# Patient Record
Sex: Male | Born: 1975 | Race: White | Hispanic: No | Marital: Single | Smoking: Never smoker
Health system: Southern US, Community
[De-identification: ages and names within clinical notes are randomized; demographics above are authoritative.]

## PROBLEM LIST (undated history)

## (undated) DIAGNOSIS — J302 Other seasonal allergic rhinitis: Secondary | ICD-10-CM

---

## 2014-05-14 ENCOUNTER — Encounter: Payer: Self-pay | Admitting: Emergency Medicine

## 2014-05-14 ENCOUNTER — Emergency Department (INDEPENDENT_AMBULATORY_CARE_PROVIDER_SITE_OTHER)
Admission: EM | Admit: 2014-05-14 | Discharge: 2014-05-14 | Disposition: A | Discharge status: Home / Self Care | Payer: BC Managed Care – PPO | Source: Home / Self Care | Attending: Emergency Medicine | Admitting: Emergency Medicine

## 2014-05-14 DIAGNOSIS — J039 Acute tonsillitis, unspecified: Secondary | ICD-10-CM

## 2014-05-14 MED ORDER — PENICILLIN V POTASSIUM 500 MG PO TABS: 500.0000 mg | ORAL_TABLET | Freq: Four times a day (QID) | ORAL | Status: DC

## 2014-05-14 NOTE — Discharge Instructions (Signed)

## 2014-05-14 NOTE — ED Provider Notes (Signed)
CSN: [id number]     Arrival date & time 05/14/14  1221 History   First MD Initiated Contact with Patient 05/14/14 1240     Chief Complaint  Patient presents with  . Sore Throat   (Consider location/radiation/quality/duration/timing/severity/associated sxs/prior Treatment) Patient is a 38 y.o. male presenting with pharyngitis. The history is provided by the patient.  Sore Throat This is a new problem. The current episode started 2 days ago. The problem occurs constantly. The problem has been gradually worsening. Pertinent negatives include no headaches and no shortness of breath. Nothing aggravates the symptoms. Nothing relieves the symptoms. He has tried nothing for the symptoms.  Pt reports he feels like he has strep  History reviewed. No pertinent past medical history. History reviewed. No pertinent past surgical history. No family history on file. History  Substance Use Topics  . Smoking status: Never Smoker   . Smokeless tobacco: Not on file  . Alcohol Use: Yes    Review of Systems  Respiratory: Negative for shortness of breath.   Neurological: Negative for headaches.  All other systems reviewed and are negative.   Allergies  Review of patient's allergies indicates no known allergies.  Home Medications   Prior to Admission medications   Medication Sig Start Date End Date Taking? Authorizing Provider  phenylephrine (SUDAFED PE) 10 MG TABS tablet Take 10 mg by mouth every 4 (four) hours as needed.   Yes Historical Provider, MD  sodium chloride (OCEAN) 0.65 % SOLN nasal spray Place 1 spray into both nostrils as needed for congestion.   Yes Historical Provider, MD  penicillin v potassium (VEETID) 500 MG tablet Take 1 tablet (500 mg total) by mouth 4 (four) times daily. 05/14/14 05/21/14  Elson AreasLeslie K Sofia, PA-C   BP 122/81 mmHg  Pulse 85  Temp(Src) 98.5 F (36.9 C) (Oral)  Ht 6\' 2"  (1.88 m)  Wt 233 lb (105.688 kg)  BMI 29.90 kg/m2  SpO2 97% Physical Exam  Constitutional:  He is oriented to person, place, and time. He appears well-developed and well-nourished.  HENT:  Head: Normocephalic and atraumatic.  Eyes: EOM are normal. Pupils are equal, round, and reactive to light.  Neck: Normal range of motion.  Cardiovascular: Normal rate and normal heart sounds.   Pulmonary/Chest: Effort normal.  Abdominal: Soft. He exhibits no distension.  Musculoskeletal: Normal range of motion.  Neurological: He is alert and oriented to person, place, and time.  Skin: Skin is warm.  Psychiatric: He has a normal mood and affect.  Nursing note and vitals reviewed.   ED Course  Procedures (including critical care time) Labs Review Labs Reviewed - No data to display  Imaging Review No results found.   MDM   1. Acute tonsillitis    PCnvk Warm salt water gargles Ibuprofen AVS    Elson AreasLeslie K Sofia, PA-C 05/14/14 1254

## 2014-05-14 NOTE — ED Notes (Signed)
Sore throat x 2 days

## 2014-05-18 ENCOUNTER — Encounter: Payer: Self-pay | Admitting: *Deleted

## 2014-05-18 ENCOUNTER — Emergency Department (INDEPENDENT_AMBULATORY_CARE_PROVIDER_SITE_OTHER)
Admission: EM | Admit: 2014-05-18 | Discharge: 2014-05-18 | Disposition: A | Discharge status: Home / Self Care | Payer: BC Managed Care – PPO | Source: Home / Self Care | Attending: Emergency Medicine | Admitting: Emergency Medicine

## 2014-05-18 DIAGNOSIS — R0981 Nasal congestion: Secondary | ICD-10-CM

## 2014-05-18 DIAGNOSIS — R05 Cough: Secondary | ICD-10-CM

## 2014-05-18 DIAGNOSIS — R059 Cough, unspecified: Secondary | ICD-10-CM

## 2014-05-18 HISTORY — DX: Other seasonal allergic rhinitis: J30.2

## 2014-05-18 MED ORDER — METHYLPREDNISOLONE SODIUM SUCC 40 MG IJ SOLR
125.0000 mg | Freq: Once | INTRAMUSCULAR | Status: AC
  Administered 2014-05-18: 125 mg via INTRAMUSCULAR

## 2014-05-18 NOTE — ED Notes (Signed)
Molli HazardMatthew reports that his tonsillitis is better from his visit on 05/14/14, he now c/o dry cough and nasal congestion.

## 2014-05-18 NOTE — Discharge Instructions (Signed)
Cough, Adult  A cough is a reflex that helps clear your throat and airways. It can help heal the body or may be a reaction to an irritated airway. A cough may only last 2 or 3 weeks (acute) or may last more than 8 weeks (chronic).  CAUSES Acute cough:  Viral or bacterial infections. Chronic cough:  Infections.  Allergies.  Asthma.  Post-nasal drip.  Smoking.  Heartburn or acid reflux.  Some medicines.  Chronic lung problems (COPD).  Cancer. SYMPTOMS   Cough.  Fever.  Chest pain.  Increased breathing rate.  High-pitched whistling sound when breathing (wheezing).  Colored mucus that you cough up (sputum). TREATMENT   A bacterial cough may be treated with antibiotic medicine.  A viral cough must run its course and will not respond to antibiotics.  Your caregiver may recommend other treatments if you have a chronic cough. HOME CARE INSTRUCTIONS   Only take over-the-counter or prescription medicines for pain, discomfort, or fever as directed by your caregiver. Use cough suppressants only as directed by your caregiver.  Use a cold steam vaporizer or humidifier in your bedroom or home to help loosen secretions.  Sleep in a semi-upright position if your cough is worse at night.  Rest as needed.  Stop smoking if you smoke. SEEK IMMEDIATE MEDICAL CARE IF:   You have pus in your sputum.  Your cough starts to worsen.  You cannot control your cough with suppressants and are losing sleep.  You begin coughing up blood.  You have difficulty breathing.  You develop pain which is getting worse or is uncontrolled with medicine.  You have a fever. MAKE SURE YOU:   Understand these instructions.  Will watch your condition.  Will get help right away if you are not doing well or get worse. Document Released: 12/23/2010 Document Revised: 09/18/2011 Document Reviewed: 12/23/2010 Exit[employer] Patient Information 2015 [employer]. This information is not intended  to replace advice given to you by your health care provider. Make sure you discuss any questions you have with your health care provider.  

## 2014-05-18 NOTE — ED Provider Notes (Signed)
CSN: [id number]     Arrival date & time 05/18/14  1321 History   First MD Initiated Contact with Patient 05/18/14 1348     Chief Complaint  Patient presents with  . Cough  . Nasal Congestion   (Consider location/radiation/quality/duration/timing/severity/associated sxs/prior Treatment) Patient is a 38 y.o. male presenting with cough. The history is provided by the patient. No language interpreter was used.  Cough Cough characteristics:  Non-productive Severity:  Moderate Onset quality:  Gradual Duration:  1 week Timing:  Constant Progression:  Worsening Chronicity:  New Smoker: no   Context: upper respiratory infection   Relieved by:  Nothing Worsened by:  Nothing tried Associated symptoms: no fever   Risk factors: recent infection   patient reports his throat is feeling better after being seen here on 11 5 and starting penicillin for tonsillitis. Patient reports that he still has nasal congestion and has a dry cough that is persistent and aggravating  Past Medical History  Diagnosis Date  . Seasonal allergies    History reviewed. No pertinent past surgical history. History reviewed. No pertinent family history. History  Substance Use Topics  . Smoking status: Never Smoker   . Smokeless tobacco: Not on file  . Alcohol Use: Yes    Review of Systems  Constitutional: Negative for fever.  Respiratory: Positive for cough.   All other systems reviewed and are negative.   Allergies  Review of patient's allergies indicates no known allergies.  Home Medications   Prior to Admission medications   Medication Sig Start Date End Date Taking? Authorizing Provider  penicillin v potassium (VEETID) 500 MG tablet Take 1 tablet (500 mg total) by mouth 4 (four) times daily. 05/14/14 05/21/14  Elson AreasLeslie K Aryn Kops, PA-C  phenylephrine (SUDAFED PE) 10 MG TABS tablet Take 10 mg by mouth every 4 (four) hours as needed.    Historical Provider, MD  sodium chloride (OCEAN) 0.65 % SOLN nasal spray  Place 1 spray into both nostrils as needed for congestion.    Historical Provider, MD   BP 123/82 mmHg  Pulse 84  Temp(Src) 98.9 F (37.2 C) (Oral)  Resp 18  Ht 6\' 2"  (1.88 m)  Wt 230 lb (104.327 kg)  BMI 29.52 kg/m2  SpO2 98% Physical Exam  Constitutional: He is oriented to person, place, and time. He appears well-developed and well-nourished.  HENT:  Head: Normocephalic and atraumatic.  Nose: Nose normal.  Mouth/Throat: Oropharynx is clear and moist.  Eyes: Conjunctivae and EOM are normal. Pupils are equal, round, and reactive to light.  Neck: Normal range of motion.  Cardiovascular: Normal rate and normal heart sounds.   Pulmonary/Chest: Effort normal and breath sounds normal.  Abdominal: He exhibits no distension.  Musculoskeletal: Normal range of motion.  Neurological: He is alert and oriented to person, place, and time.  Psychiatric: He has a normal mood and affect.  Nursing note and vitals reviewed.   ED Course  Procedures (including critical care time) Labs Review Labs Reviewed - No data to display  Imaging Review No results found.   MDM patient has significantly decreased tonsillar swelling called saying congestion may be secondary to allergies. I will give the patient a shot of Solu-Medrol he is advised to try Mucinex D. Return if any problems   1. Cough   2. Sinus congestion      Solu-Medrol 125 IM Mucinex D Continue penicillin Return if any problems Primary care referral AVS  Elson AreasLeslie K Yaniv Lage, PA-C 05/18/14 1654

## 2014-05-19 ENCOUNTER — Telehealth: Payer: Self-pay | Admitting: *Deleted

## 2014-05-20 ENCOUNTER — Encounter: Payer: Self-pay | Admitting: Emergency Medicine

## 2014-05-20 ENCOUNTER — Emergency Department (INDEPENDENT_AMBULATORY_CARE_PROVIDER_SITE_OTHER)
Admission: EM | Admit: 2014-05-20 | Discharge: 2014-05-20 | Disposition: A | Discharge status: Home / Self Care | Payer: BC Managed Care – PPO | Source: Home / Self Care | Attending: Family Medicine | Admitting: Family Medicine

## 2014-05-20 ENCOUNTER — Emergency Department (INDEPENDENT_AMBULATORY_CARE_PROVIDER_SITE_OTHER): Payer: BC Managed Care – PPO

## 2014-05-20 DIAGNOSIS — J209 Acute bronchitis, unspecified: Secondary | ICD-10-CM

## 2014-05-20 DIAGNOSIS — R059 Cough, unspecified: Secondary | ICD-10-CM

## 2014-05-20 DIAGNOSIS — R0989 Other specified symptoms and signs involving the circulatory and respiratory systems: Secondary | ICD-10-CM

## 2014-05-20 DIAGNOSIS — R042 Hemoptysis: Secondary | ICD-10-CM

## 2014-05-20 DIAGNOSIS — R05 Cough: Secondary | ICD-10-CM

## 2014-05-20 MED ORDER — GUAIFENESIN-CODEINE 100-10 MG/5ML PO SOLN: ORAL | Status: DC

## 2014-05-20 MED ORDER — AZITHROMYCIN 250 MG PO TABS: ORAL_TABLET | ORAL | Status: DC

## 2014-05-20 NOTE — Discharge Instructions (Signed)
Take plain Mucinex (1200 mg guaifenesin) twice daily for cough and congestion.  May add Sudafed for sinus congestion.   Increase fluid intake, rest. Stop all antihistamines for now, and other non-prescription cough/cold preparations. Recommend a Tdap when well.

## 2014-05-20 NOTE — ED Notes (Signed)
[facility]2014 for pharyngitis and congestion with cough; has taken rx for PCN but continues to feel worse each day with a painful cough.

## 2014-05-20 NOTE — ED Provider Notes (Signed)
CSN: [id number]     Arrival date & time 05/20/14  1019 History   First MD Initiated Contact with Patient 05/20/14 1106     Chief Complaint  Patient presents with  . Cough  . Chest Pain      HPI Comments: Patient reports that after starting penicillin, his sore throat gradually improved. He had only mild nasal congestion.  Over the past two days he has developed increasing chest congestion, and today he developed a non-productive cough.  He has also had chills/sweats.  No pleuritic pain but has tightness in anterior chest.  He does not remember his last Tdap.  The history is provided by the patient.    Past Medical History  Diagnosis Date  . Seasonal allergies    History reviewed. No pertinent past surgical history. History reviewed. No pertinent family history. History  Substance Use Topics  . Smoking status: Never Smoker   . Smokeless tobacco: Not on file  . Alcohol Use: Yes    Review of Systems + sore throat, resolved + cough No pleuritic pain No wheezing + nasal congestion + post-nasal drainage No sinus pain/pressure No itchy/red eyes No earache No hemoptysis No SOB No fever, + chills/sweats No nausea No vomiting No abdominal pain No diarrhea No urinary symptoms No skin rash + fatigue No myalgias No headache Used OTC meds without relief  Allergies  Review of patient's allergies indicates no known allergies.  Home Medications   Prior to Admission medications   Medication Sig Start Date End Date Taking? Authorizing Provider  azithromycin (ZITHROMAX Z-PAK) 250 MG tablet Take 2 tabs today; then begin one tab once daily for 4 more days. 05/20/14   Lattie HawStephen A Beese, MD  guaiFENesin-codeine 100-10 MG/5ML syrup Take 10mL by mouth at bedtime as needed for cough 05/20/14   Lattie HawStephen A Beese, MD  phenylephrine (SUDAFED PE) 10 MG TABS tablet Take 10 mg by mouth every 4 (four) hours as needed.    Historical Provider, MD  sodium chloride (OCEAN) 0.65 % SOLN nasal spray  Place 1 spray into both nostrils as needed for congestion.    Historical Provider, MD   BP 114/74 mmHg  Pulse 81  Temp(Src) 98.9 F (37.2 C) (Oral)  Resp 18  SpO2 96% Physical Exam Nursing notes and Vital Signs reviewed. Appearance:  Patient appears healthy, stated age, and in no acute distress Eyes:  Pupils are equal, round, and reactive to light and accomodation.  Extraocular movement is intact.  Conjunctivae are not inflamed  Ears:  Canals normal.  Tympanic membranes normal.  Nose:  Mildly congested turbinates.  No sinus tenderness.   Pharynx:  Normal Neck:  Supple.  No adenopathy Lungs:  Clear to auscultation.  Breath sounds are equal.  Heart:  Regular rate and rhythm without murmurs, rubs, or gallops.  Abdomen:  Nontender without masses or hepatosplenomegaly.  Bowel sounds are present.  No CVA or flank tenderness.  Extremities:  No edema.  No calf tenderness Skin:  No rash present.   ED Course  Procedures  none  Imaging Review Dg Chest 2 View  05/20/2014   CLINICAL DATA:  Hemoptysis this morning, congestion for 1 day  EXAM: CHEST  2 VIEW  COMPARISON:  None  FINDINGS: Normal heart size, mediastinal contours, and pulmonary vascularity.  Lungs clear.  No pneumothorax.  Bones unremarkable.  IMPRESSION: Normal exam.   Electronically Signed   By: Ulyses SouthwardMark  Boles M.D.   On: 05/20/2014 10:51     MDM   1.  Acute bronchitis, unspecified organism   2. Cough, persistent    Begin Z-pack to cover atypicals. Robitussin AC at bedtime. Take plain Mucinex (1200 mg guaifenesin) twice daily for cough and congestion.  May add Sudafed for sinus congestion.   Increase fluid intake, rest. Stop all antihistamines for now, and other non-prescription cough/cold preparations. Recommend a Tdap when well. Followup with Family Doctor if not improved in one week.     Lattie HawStephen A Beese, MD 05/20/14 [phone]

## 2014-06-12 ENCOUNTER — Ambulatory Visit (INDEPENDENT_AMBULATORY_CARE_PROVIDER_SITE_OTHER): Payer: BC Managed Care – PPO | Admitting: Family Medicine

## 2014-06-12 ENCOUNTER — Encounter: Payer: Self-pay | Admitting: Family Medicine

## 2014-06-12 VITALS — BP 124/80 | HR 90 | Ht 74.0 in | Wt 228.0 lb

## 2014-06-12 DIAGNOSIS — Z021 Encounter for pre-employment examination: Secondary | ICD-10-CM | POA: Diagnosis not present

## 2014-06-12 NOTE — Progress Notes (Signed)
CC: Juan PlattMatthew Larsen is a 38 y.o. male is here for Establish Care   Subjective: HPI:  Very pleasant 38 year old here to establish care  He has no acute complaints and is only requesting to have a preemployment health screening examination. He needs carbon monoxide checked and has brought in paperwork to be completed.  Rare alcohol use no tobacco or recreational drug use  Review of Systems - General ROS: negative for - chills, fever, night sweats, weight gain or weight loss Ophthalmic ROS: negative for - decreased vision Psychological ROS: negative for - anxiety or depression ENT ROS: negative for - hearing change, nasal congestion, tinnitus or allergies Hematological and Lymphatic ROS: negative for - bleeding problems, bruising or swollen lymph nodes Breast ROS: negative Respiratory ROS: no cough, shortness of breath, or wheezing Cardiovascular ROS: no chest pain or dyspnea on exertion Gastrointestinal ROS: no abdominal pain, change in bowel habits, or black or bloody stools Genito-Urinary ROS: negative for - genital discharge, genital ulcers, incontinence or abnormal bleeding from genitals Musculoskeletal ROS: negative for - joint pain or muscle pain Neurological ROS: negative for - headaches or memory loss Dermatological ROS: negative for lumps, mole changes, rash and skin lesion changes  Past Medical History  Diagnosis Date  . Seasonal allergies     No past surgical history on file. No family history on file.  History   Social History  . Marital Status: Single    Spouse Name: N/A    Number of Children: N/A  . Years of Education: N/A   Occupational History  . Not on file.   Social History Main Topics  . Smoking status: Never Smoker   . Smokeless tobacco: Not on file  . Alcohol Use: Yes  . Drug Use: No  . Sexual Activity: Not on file   Other Topics Concern  . Not on file   Social History Narrative     Objective: BP 124/80 mmHg  Pulse 90  Ht 6\' 2"  (1.88 m)  Wt  228 lb (103.42 kg)  BMI 29.26 kg/m2  General: Alert and Oriented, No Acute Distress HEENT: Pupils equal, round, reactive to light. Conjunctivae clear.  External ears unremarkable, canals clear with intact TMs with appropriate landmarks.  Middle ear appears open without effusion. Pink inferior turbinates.  Moist mucous membranes, pharynx without inflammation nor lesions.  Neck supple without palpable lymphadenopathy nor abnormal masses. Lungs: Clear to auscultation bilaterally, no wheezing/ronchi/rales.  Comfortable work of breathing. Good air movement. Cardiac: Regular rate and rhythm. Normal S1/S2.  No murmurs, rubs, nor gallops.   Abdomen: Soft and flat Extremities: No peripheral edema.  Strong peripheral pulses.  Mental Status: No depression, anxiety, nor agitation. Skin: Warm and dry.  Assessment & Plan: Juan HazardMatthew was seen today for establish care.  Diagnoses and associated orders for this visit:  Pre-employment health screening examination - Carbon Monoxide, Blood - Carboxyhemoglobin - Miscellaneous test    Healthy lifestyle interventions including but not limited to regular exercise, a healthy low fat diet, moderation of salt intake, the dangers of tobacco/alcohol/recreational drug use, nutrition supplementation, and accident avoidance were discussed with the patient and a handout was provided for future reference.  Paperwork will be completed once labs above are returned.  Return if symptoms worsen or fail to improve.

## 2014-06-14 LAB — CARBOXYHEMOGLOBIN: Carboxyhemoglobin: 4 %TOTAL HGB (ref <=12)

## 2014-06-18 ENCOUNTER — Encounter: Payer: Self-pay | Admitting: *Deleted

## 2014-06-18 ENCOUNTER — Telehealth: Payer: Self-pay | Admitting: Family Medicine

## 2014-06-18 DIAGNOSIS — Z0289 Encounter for other administrative examinations: Secondary | ICD-10-CM

## 2014-06-18 NOTE — Telephone Encounter (Signed)
Patient walked in today wanting to discuss his carbon monoxide test, based on the cutoffs that his insurance company has established he is in the smoker range however he is adamant that he does not smoke tobacco. In hopes of swaying the insurance companies determination on whether or not he is a "smoker" I offered him blood work to look for nicotine metabolites in his bloodstream in hopes that if this is negative he'll be further evidence that he is not a tobacco smoker.

## 2014-06-19 LAB — NICOTINE/COTININE METABOLITES: Cotinine: <10 ng/mL

## 2014-07-20 ENCOUNTER — Encounter: Payer: Self-pay | Admitting: Family Medicine

## 2014-07-20 DIAGNOSIS — L209 Atopic dermatitis, unspecified: Secondary | ICD-10-CM | POA: Insufficient documentation

## 2014-09-07 ENCOUNTER — Telehealth: Payer: Self-pay | Admitting: *Deleted

## 2014-09-07 NOTE — Telephone Encounter (Signed)
Pt has left two voice mails on my phone requesting someone call him back in re to an insurance claim. Pt states his last visit was "coded incorrectly" and needs to be fixed. Can you please look into this? thanks

## 2016-04-15 IMAGING — CR DG CHEST 2V
2 series · 2 of 2 positions shown · non-contrast
Comparison: None

CLINICAL DATA: Hemoptysis this morning, congestion for 1 day

EXAM:
CHEST  2 VIEW

[view not recorded (1 of 2)]
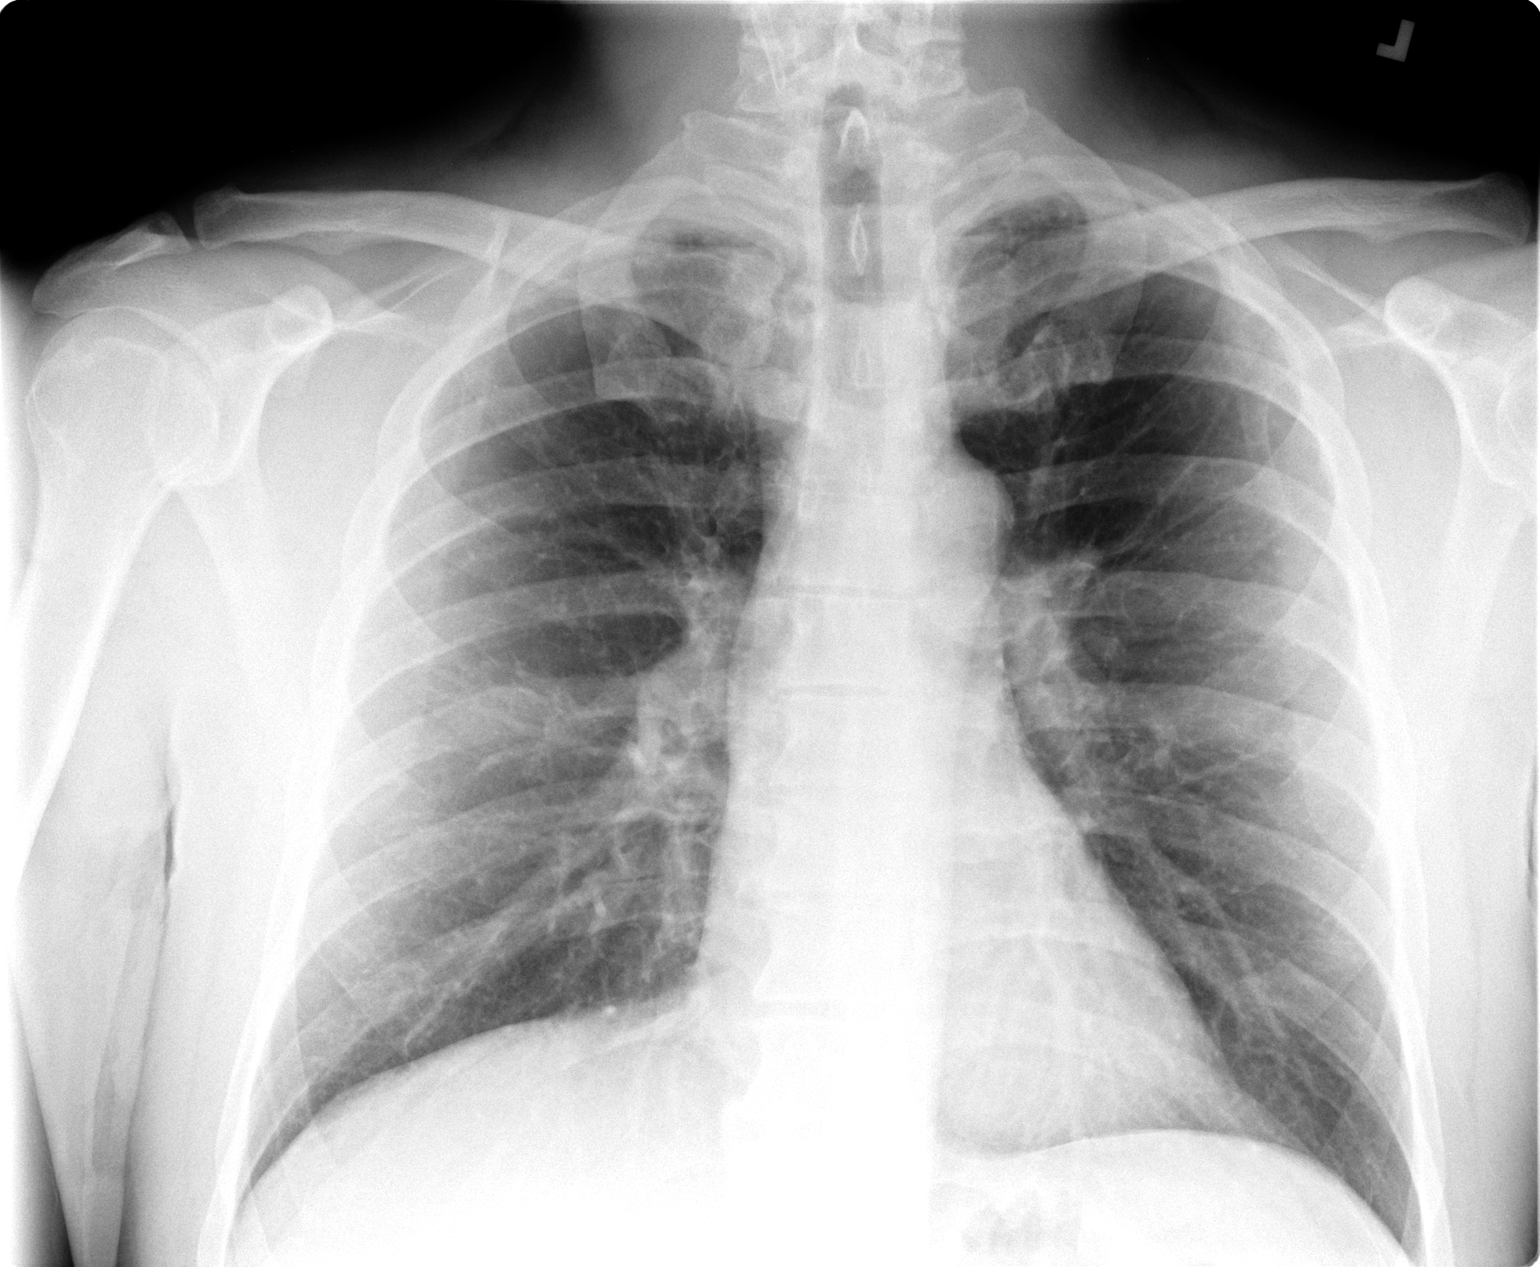

[view not recorded (2 of 2)]
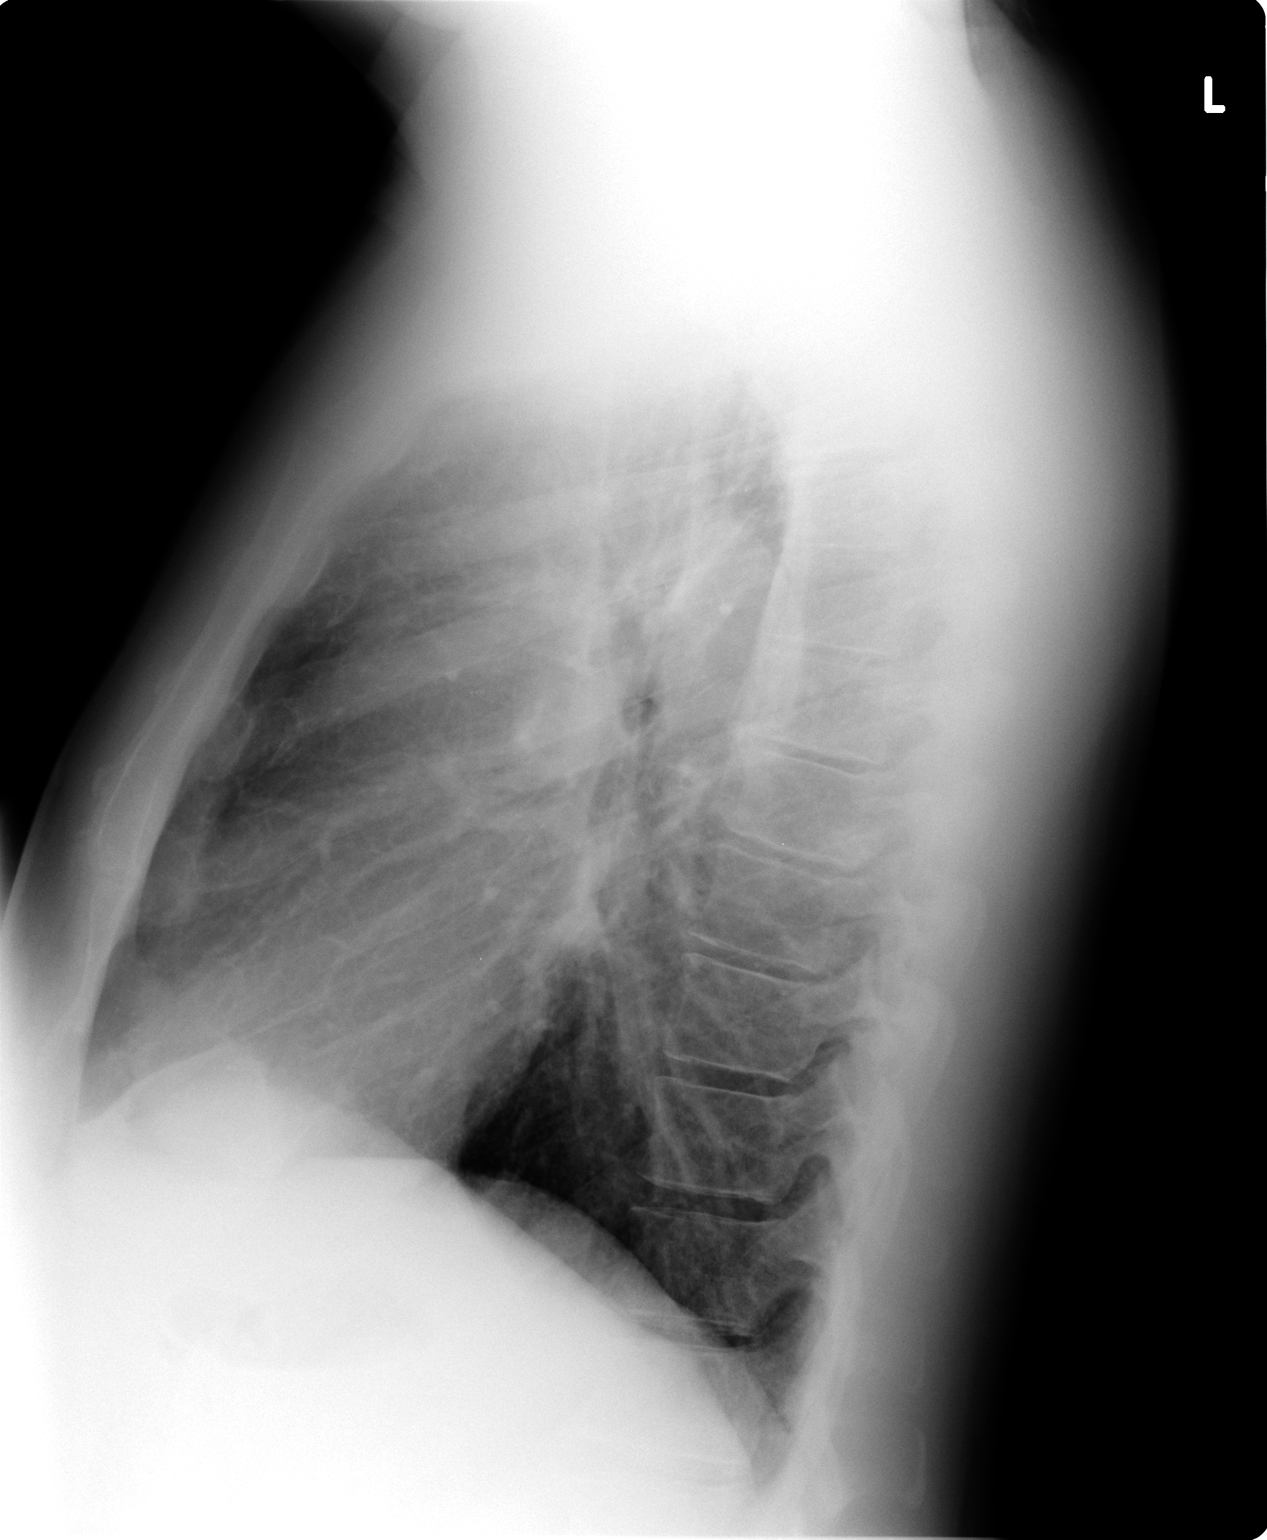

[2 of 2 positions shown; findings below may reference images not displayed]

FINDINGS: Normal heart size, mediastinal contours, and pulmonary vascularity.

Lungs clear.

No pneumothorax.

Bones unremarkable.
IMPRESSION: Normal exam.
